# Patient Record
Sex: Female | Born: 1965 | Hispanic: No | Marital: Single | Smoking: Never smoker
Health system: Southern US, Community
[De-identification: ages and names within clinical notes are randomized; demographics above are authoritative.]

## PROBLEM LIST (undated history)

## (undated) DIAGNOSIS — I10 Essential (primary) hypertension: Secondary | ICD-10-CM

## (undated) DIAGNOSIS — E785 Hyperlipidemia, unspecified: Secondary | ICD-10-CM

## (undated) HISTORY — PX: TUBAL LIGATION: SHX77

---

## 1998-09-14 ENCOUNTER — Other Ambulatory Visit: Admission: RE | Admit: 1998-09-14 | Discharge: 1998-09-14 | Payer: Self-pay | Admitting: *Deleted

## 1999-02-07 ENCOUNTER — Encounter: Payer: Self-pay | Admitting: Obstetrics & Gynecology

## 1999-02-07 ENCOUNTER — Inpatient Hospital Stay (HOSPITAL_COMMUNITY): Admission: AD | Admit: 1999-02-07 | Discharge: 1999-02-07 | Payer: Self-pay | Admitting: *Deleted

## 1999-02-10 ENCOUNTER — Inpatient Hospital Stay (HOSPITAL_COMMUNITY): Admission: AD | Admit: 1999-02-10 | Discharge: 1999-02-11 | Payer: Self-pay | Admitting: Obstetrics & Gynecology

## 1999-02-12 ENCOUNTER — Inpatient Hospital Stay (HOSPITAL_COMMUNITY): Admission: AD | Admit: 1999-02-12 | Discharge: 1999-02-12 | Payer: Self-pay | Admitting: *Deleted

## 1999-03-02 ENCOUNTER — Inpatient Hospital Stay (HOSPITAL_COMMUNITY): Admission: AD | Admit: 1999-03-02 | Discharge: 1999-03-14 | Payer: Self-pay | Admitting: *Deleted

## 1999-03-02 ENCOUNTER — Encounter: Payer: Self-pay | Admitting: *Deleted

## 1999-03-24 ENCOUNTER — Inpatient Hospital Stay (HOSPITAL_COMMUNITY): Admission: AD | Admit: 1999-03-24 | Discharge: 1999-03-24 | Payer: Self-pay | Admitting: Obstetrics & Gynecology

## 1999-08-11 ENCOUNTER — Other Ambulatory Visit: Admission: RE | Admit: 1999-08-11 | Discharge: 1999-08-11 | Payer: Self-pay | Admitting: *Deleted

## 2001-10-20 ENCOUNTER — Other Ambulatory Visit: Admission: RE | Admit: 2001-10-20 | Discharge: 2001-10-20 | Payer: Self-pay | Admitting: *Deleted

## 2001-12-23 ENCOUNTER — Encounter: Payer: Self-pay | Admitting: *Deleted

## 2001-12-23 ENCOUNTER — Inpatient Hospital Stay (HOSPITAL_COMMUNITY): Admission: AD | Admit: 2001-12-23 | Discharge: 2001-12-28 | Payer: Self-pay | Admitting: *Deleted

## 2002-01-11 ENCOUNTER — Encounter: Payer: Self-pay | Admitting: *Deleted

## 2002-01-11 ENCOUNTER — Inpatient Hospital Stay (HOSPITAL_COMMUNITY): Admission: AD | Admit: 2002-01-11 | Discharge: 2002-01-11 | Payer: Self-pay | Admitting: *Deleted

## 2002-04-28 ENCOUNTER — Inpatient Hospital Stay (HOSPITAL_COMMUNITY): Admission: AD | Admit: 2002-04-28 | Discharge: 2002-04-28 | Payer: Self-pay | Admitting: *Deleted

## 2002-05-06 ENCOUNTER — Inpatient Hospital Stay (HOSPITAL_COMMUNITY): Admission: AD | Admit: 2002-05-06 | Discharge: 2002-05-10 | Payer: Self-pay | Admitting: *Deleted

## 2002-05-07 ENCOUNTER — Encounter (INDEPENDENT_AMBULATORY_CARE_PROVIDER_SITE_OTHER): Payer: Self-pay

## 2002-05-13 ENCOUNTER — Inpatient Hospital Stay (HOSPITAL_COMMUNITY): Admission: AD | Admit: 2002-05-13 | Discharge: 2002-05-13 | Payer: Self-pay | Admitting: *Deleted

## 2005-09-06 ENCOUNTER — Encounter: Admission: RE | Admit: 2005-09-06 | Discharge: 2005-09-06 | Payer: Self-pay | Admitting: Obstetrics

## 2010-03-13 ENCOUNTER — Emergency Department (HOSPITAL_BASED_OUTPATIENT_CLINIC_OR_DEPARTMENT_OTHER): Admission: EM | Admit: 2010-03-13 | Discharge: 2010-03-13 | Payer: Self-pay | Admitting: Emergency Medicine

## 2010-08-02 ENCOUNTER — Other Ambulatory Visit: Payer: Self-pay | Admitting: Obstetrics

## 2010-08-03 ENCOUNTER — Other Ambulatory Visit: Payer: Self-pay | Admitting: Obstetrics

## 2010-08-03 DIAGNOSIS — Z1231 Encounter for screening mammogram for malignant neoplasm of breast: Secondary | ICD-10-CM

## 2010-08-03 DIAGNOSIS — Z1239 Encounter for other screening for malignant neoplasm of breast: Secondary | ICD-10-CM

## 2010-08-11 ENCOUNTER — Ambulatory Visit
Admission: RE | Admit: 2010-08-11 | Discharge: 2010-08-11 | Disposition: A | Discharge status: Home / Self Care | Payer: PRIVATE HEALTH INSURANCE | Source: Ambulatory Visit | Attending: Obstetrics | Admitting: Obstetrics

## 2010-08-11 DIAGNOSIS — Z1231 Encounter for screening mammogram for malignant neoplasm of breast: Secondary | ICD-10-CM

## 2010-08-17 ENCOUNTER — Other Ambulatory Visit: Payer: Self-pay | Admitting: Obstetrics

## 2010-08-17 DIAGNOSIS — R928 Other abnormal and inconclusive findings on diagnostic imaging of breast: Secondary | ICD-10-CM

## 2010-08-30 ENCOUNTER — Other Ambulatory Visit: Payer: Self-pay | Admitting: Obstetrics

## 2010-08-30 ENCOUNTER — Ambulatory Visit
Admission: RE | Admit: 2010-08-30 | Discharge: 2010-08-30 | Disposition: A | Discharge status: Home / Self Care | Payer: PRIVATE HEALTH INSURANCE | Source: Ambulatory Visit | Attending: Obstetrics | Admitting: Obstetrics

## 2010-08-30 DIAGNOSIS — R928 Other abnormal and inconclusive findings on diagnostic imaging of breast: Secondary | ICD-10-CM

## 2010-11-17 NOTE — H&P (Signed)
[facility] of [facility]  Patient:    Angela Ingram, Angela Ingram Visit Number: [id number] MRN: [id number]          Service Type: OBS Location: 910A 9119 01 Attending Physician:  Pleas Koch Dictated by:   Georgina Peer, M.D. Admit Date:  12/23/2001                           History and Physical  ADMISSION DIAGNOSES:          1. Pregnancy, 18-1/2 weeks.                               2. Vaginal bleeding.                               3. Positive group B Streptococcus.  HISTORY OF PRESENT ILLNESS:   The patient is a 45 year old gravida 5, para 1-0-3-1, [facility] May 21, 2002, who presented with a one-day history of vaginal bleeding which was dark without cramping, positive fetal movement. She was evaluated in the office and found to have a closed cervix but shortened and a bloody discharge and was admitted for observation.  PAST MEDICAL HISTORY:        Prenatal:  The patient had early confirmed dates. She had a history of positive group B strep at 13 weeks with a negative wet prep, negative GC and Chlamydia cultures, good growth, an ultrasound showing normal anatomy, good growth, and a cervical length of 2.3 cm two weeks ago. Medical:  She had an elective abortion at eight weeks, a missed abortion at 11 weeks, both with D&Cs.  C-section in 1996.  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  A 23-week preterm delivery which presented with advanced cervical dilatation and bulging membranes in 2000, at which time group B strep was also positive.  The patient has also advanced maternal age and declined amniocentesis.  Other pertinent past medical history is negative.  REVIEW OF SYSTEMS:            Otherwise negative.  FAMILY HISTORY:               Not significant.  LABORATORY DATA:              Blood type O positive, antibody negative. Sickle negative.  VDRL nonreactive.  Rubella positive.  HBsAG negative.  Pap normal.  GC/Chlamydia negative.  GBS  positive, wet prep negative.  ALLERGIES:                    PENICILLIN, which causes rash.  PHYSICAL EXAMINATION:  VITAL SIGNS:                  Afebrile.  Vital signs stable.  GENERAL:                      A well-developed African-American female in no acute distress.  HEENT:                        Normal.  NECK:                         Without JVD or thyromegaly.  CHEST:  Lungs clear.  CARDIAC:                      Regular rate and rhythm.  BREASTS:                      Not examined.  ABDOMEN:                      Soft and nontender.  Uterus is one fingerbreadth below the umbilicus, consistent with 18-19 weeks.  There are fetal heart tones in the midline, which are 140.  There is no lower abdominal tenderness.  There is no CVA tenderness.  PELVIC:                       Normal external genital.  Dark blood in the vagina.  Cervix was noted to be closed but shortened.  The presenting part was not palpable.  Group B strep was repeated.  EXTREMITIES:                  Without cyanosis or edema.  IMPRESSION:                   1. Eighteen plus weeks.                               2. Positive group B Streptococcus.                               3. Vaginal bleeding.                               4. Shortened cervix.                               5. History of previous preterm loss.  PLAN:                         The patient will be admitted for IV antibiotics in the form of Clindamycin 900 mg q.8h., bed rest.  An ultrasound will be performed to look at cervical length and growth.  Consult with Conni Elliot, M.D.  Will check sensitivities on the group B strep, which was done before the Clindamycin was started.  Close monitoring of this patient for cervical change and preterm delivery. Dictated by:   Georgina Peer, M.D. Attending Physician:  Pleas Koch DD:  12/23/01 TD:  12/23/01 Job: [id number]

## 2010-11-17 NOTE — Discharge Summary (Signed)
[facility] of [facility]  Patient:    Angela Ingram, Angela Ingram Visit Number: [id number] MRN: [id number]          Service Type: MED Location: 910A 9119 01 Attending Physician:  Pleas Koch Dictated by:   Georgina Peer, M.D. Admit Date:  12/23/2001 Discharge Date: 12/28/2001                             Discharge Summary  ADMISSION DIAGNOSES:          1. Pregnancy at 18-1/2 weeks.                               2. Vaginal bleeding.                               3. Positive Group B Strep.                               4. Possible incompetent cervix.  DISCHARGE DIAGNOSES:          1. Pregnancy at 18-1/2 weeks.                               2. Vaginal bleeding, resolved.                               3. Positive Group B Strep.                               4. Possible incompetent cervix.  BRIEF HISTORY:                A 45 year old, gravida 5, para 1-0-3-1, [facility] May 21, 2002, presented with vaginal bleeding, shortened cervix, and positive Group B Strep.  HOSPITAL COURSE:              The patient was admitted and placed on Clindamycin because of a penicillin allergy.  The bleeding resolved within 24 hours.  The patient had been on oral Clindamycin approximately two weeks earlier.  A Group B Strep was repeated and was negative.  Consultation with Conni Elliot, M.D. was performed.  It was thought that with the shortened cervix, the patient would be a cerclage candidate after antibiotic treatment. An ultrasound had been performed showing normal fluid, normal growth, and their cervical measurement was 5.3 cm, however, in the office it was 2.3 cm. The patient had a history of a LEEP procedure prior to this pregnancy and a previous pregnancy loss.  She was doing well and was taken for a cervical cerclage on December 26, 2001, which was done under spinal.  There was very little vaginal cervix left.  Approximately 1 to 1.5 cm of cervix was incorporated into the  cerclage stitch which was then tied securely with the cervix closed. She was continued on clindamycin postoperatively.  She did not have a fever. She had minimal cramping which responded to Motrin.  By the second postoperative day, December 28, 2001, the patient was doing well and was ready for discharge.  Her abdomen was soft and nontender, fetal heart rate was 140 to 150 and she  was having no red vaginal bleeding.  She was sent home on bed rest with bathroom privileges and Tylenol or Motrin as needed.  She will follow up in the office in four days for cervical examination. Dictated by:   Georgina Peer, M.D. Attending Physician:  Pleas Koch DD:  12/28/01 TD:  12/29/01 Job: [id number]

## 2010-11-17 NOTE — Op Note (Signed)
[facility] of [facility]  Patient:    Angela Ingram, Angela Ingram Visit Number: [id number] MRN: [id number]          Service Type: MED Location: 910A 9119 01 Attending Physician:  Pleas Koch Dictated by:   Georgina Peer, M.D. Proc. Date: 12/26/01 Admit Date:  12/23/2001 Discharge Date: 12/28/2001                             Operative Report  PREOPERATIVE DIAGNOSES:       1. Intrauterine pregnancy at 19 weeks.                               2. Incompetent cervix.                               3. Positive group B strep.                               4. Second trimester bleeding.  POSTOPERATIVE DIAGNOSES:      1. Intrauterine pregnancy at 19 weeks.                               2. Incompetent cervix.                               3. Positive group B strep.                               4. Second trimester bleeding.  OPERATION PERFORMED:          McDonald cervical cerclage.  SURGEON:                      Georgina Peer, M.D.  ANESTHESIA:                   Spinal.  ANESTHESIOLOGIST:             Ellison Hughs., M.D.  ESTIMATED BLOOD LOSS:         25 cc.  COMPLICATIONS:                None.  FINDINGS:                     Shortened vaginal portion of the cervix to 1.5  cm with distorted anatomy status post LEEP procedure.  INDICATIONS:                  This is a 45 year old gravida 5, para 1-0-3-1 with a previous 22-week loss from early dilation, preterm labor and group B strep.  She was admitted on June 24 with vaginal bleeding and positive group B strep.  She was placed on IV antibiotics.  She had a shortened vaginal cervical length of 1.5 cm, but a total cervical length of by ultrasound of between 2.3 and 5.3 cm on subsequent ultrasounds.  The bleeding stopped within IV clindamycin, as the patient was penicillin allergic.  After consultation with Dr. Corky Sox, it was felt that McDonald cervical cerclage would give the patient an increased  chance of  a viable pregnancy that would progress further than without, given the patients prior history of loss at 22 weeks and LEEP procedure.  The patient agreed and was willing to proceed.  DESCRIPTION OF PROCEDURE:     The patient was taken to the operating room, given a spinal anesthetic and placed in the dorsal lithotomy position.  Fetal heart tones were auscultated.  The cervix was anterior along the right vaginal side wall.  It was difficult to discern from the vaginal mucosa.  However, with adequate retraction, the cervix was noted and the cervical os was noted to be closed.  A Mersilene tape was used, starting at the 12 oclock position and going from 12 to 9, 9 to 6, 6 to 3, 3 to 12.  A Prolene was placed under the knot to help elevate it for removal, and both knots were securely tied. There was minimal bleeding.  There was approximately 1.5 cm of cervical length at the end of the procedure and the os was tightly closed.  The patient was transferred to the recovery area in good condition.  Sponge, needle and instrument counts were correct.  The patient will return to her mother-baby room to continue IV clindamycin. Dictated by:   Georgina Peer, M.D. Attending Physician:  Pleas Koch DD:  12/26/01 TD:  12/28/01 Job: [id number]

## 2010-11-17 NOTE — Discharge Summary (Signed)
NAME:  Angela Ingram, Angela Ingram                            ACCOUNT NO.:  [id number]   MEDICAL RECORD NO.:  [id number]                   PATIENT TYPE:  INP   LOCATION:  9128                                 FACILITY:  [facility]   PHYSICIAN:  Georgina Peer, M.D.              DATE OF BIRTH:  August 12, 1965   DATE OF ADMISSION:  05/06/2002  DATE OF DISCHARGE:  05/10/2002                                 DISCHARGE SUMMARY   ADMISSION DIAGNOSES:  1. Pregnancy, 38 weeks.  2. Previous cesarean section.  3. Failed attempted vaginal birth after cesarean section.  4. Desiring permanent sterilization.   POSTOPERATIVE DIAGNOSES:  1. Pregnancy, 38 weeks.  2. Previous cesarean section.  3. Failed attempted vaginal birth after cesarean section.  4. Desiring permanent sterilization.  5. Post dural puncture headache.  6. Compensated anemia.   BRIEF HISTORY:  A 45 year old gravida 5 para 1-0-3-1, [facility] May 21, 2002,  presented with spontaneous rupture of membranes on November 5.  She was  allowed to labor, knowing the risks and complications of vaginal birth after  cesarean.  She had had a cerclage removed at 36.5 weeks, which was placed at  19 weeks following cervical incompetence diagnosis.  She wanted a postpartum  tubal ligation.   HOSPITAL COURSE:  She labored, was augmented, but failed to progress past 3-  4 cm with adequate contractions.  She was taken for repeat low transverse  cesarean section and postpartum tubal ligation with a blood loss of 1000 cc;  a viable female infant, Szia, weighing 6 pounds 10 ounces, Apgars of 8 and  9.  Tubal ligation was accomplished without difficulty.  Postoperatively the  patient did well except did have a positional headache, was seen by Dr.  Leilani Able of the anesthesia department who counseled her on  different options.  The patient was given caffeine, IV fluids, and supine  positioning.  The headache was still present by the third day and she  declined  further intervention.  She was on Percocet for pain.  She passed  gas and had a bowel movement by the third day.  She was on a regular diet.  Her wound was intact and dry.  She was voiding without difficulty.  She had  no nausea and vomiting.  The patient had a hemoglobin of 8.6 with a  preoperative hemoglobin of 11.4 and a wbc of 10.3, and adequate platelet  count.  Her staples were removed on the third day.  She was doing well on  Percocet and Motrin and was discharged home with a discharge booklet and  instructions.   DISPOSITION:  She will follow up in the office in two weeks.  She was also  given a prescription for Niferex-150 Forte to take once or twice daily as  tolerated.  Georgina Peer, M.D.    JPN/MEDQ  D:  05/10/2002  T:  05/11/2002  Job:  [id number]

## 2010-11-17 NOTE — Op Note (Signed)
NAME:  Angela Ingram, Angela Ingram                            ACCOUNT NO.:  [id number]   MEDICAL RECORD NO.:  [id number]                   PATIENT TYPE:  INP   LOCATION:  9128                                 FACILITY:  [facility]   PHYSICIAN:  Georgina Peer, M.D.              DATE OF BIRTH:  08/11/1965   DATE OF PROCEDURE:  05/07/2002  DATE OF DISCHARGE:                                 OPERATIVE REPORT   PREOPERATIVE DIAGNOSES:  1. Intrauterine pregnancy 39 weeks.  2. Failed vaginal birth after cesarean.  3. Desires elective sterilization.  4. Fetal tachycardia.   POSTOPERATIVE DIAGNOSES:  1. Intrauterine pregnancy 39 weeks.  2. Failed vaginal birth after cesarean.  3. Desires elective sterilization.  4. Fetal tachycardia.   OPERATION PERFORMED:  1. Repeat low cervical transverse cesarean section.  2. Postpartum tubal ligation.   SURGEON:  Georgina Peer, M.D.   ANESTHESIA:  Epidural.   ESTIMATED BLOOD LOSS:  1000 cc.   FINDINGS:  Viable female infant delivered at 7:13 a.m. 6 pounds 10 ounces  with Apgars of 8 and 9.  Normal tubes and ovaries.   INDICATIONS:  This is a 45 year old gravida 5, para 1-0-3-1, [facility] May 21, 2002 presented with ruptured membranes at 38 weeks, 3 cm dilated.  The  patient progressed to 3-4 cm and received an epidural.  Contractions space  and Pitocin augmented.  The fetal vertex was not well engaged in the pelvis  at -3, but vertex by ultrasound.  Intrauterine pressure catheter was placed  and Pitocin augmentation failed to cause dilation of the cervix or  effacement or descent of the head.  A baseline fetal tachycardia was noted  between 160-175 with accelerations and no decelerations and there was good  beat to beat variability.  However, at 6 a.m. since the patient had not  changed since 1 a.m. it was decided that failure to progress was diagnosed  and a repeat cesarean section was planned.  The patient also desired  sterilization which had been  discussed in the office.  Risks and  complications of the sterilization and failure rate of 3-4 per 1000 as a  lifetime risk was acknowledged by the patient.   DESCRIPTION OF PROCEDURE:  The patient was taken to the operating room,  placed in the dorsal supine left lateral uterine displacement position.  Foley catheter had been placed in her bladder and an epidural was then  placed during labor which was dosed appropriately.  She was prepped and  draped and a transverse incision was made lower abdomen in the area of the  previous abdominal scar.  This was taken down into the peritoneal cavity.  There was evidence of scarring of the abdominal wall layers and these were  dissected free.  The peritoneal cavity was entered and there was noted to be  two areas of omental adhesions to the anterior abdominal wall.  The  peritoneal incision was widened.  A transverse bladder flap was created and  a transverse uterine incision was made.  At 7:13 a.m. infant was delivered  with the aid of fundal pressure.  Cried spontaneously and was a female who  weighed 6 pounds 10 ounces.  Cord was doubly clamped and cut, the infant  handed to the pediatric team in attendance.  Cord blood was taken.  The  placenta and membranes were removed without defect.  Uterine incision was  without defect.  It was closed with 0 Monocryl.  A second layer of Monocryl  was needed to control bleeding.  Interrupted sutures of Monocryl and Vicryl  were used to control bleeding along the edges of the incision which was  finally hemostatic.  The pelvis was irrigated.  Attention was then taken to  the tubes.  The left tube was identified.  The left ovary was normal.  The  tube was traced to its fimbriated end, a knuckle elevated with the Babcock  clamp, and two plain gut sutures were tied around the tube and the mid  portion excised.  Both lumens identified.  Bleeders were cauterized.  A  similar procedure was noted and performed on  the right tube which was traced  to its fimbriated end, doubly tied over a loop which had been elevated with  the Babcock clamp.  The mid portion knuckle was then excised and the lumens  identified and the bleeders cauterized.  The incision of the uterus was  again attended to and was hemostatic.  The bladder flap was hemostatic.  The  peritoneum was closed after the omental adhesions were lysed and free tied.  There was oozing over the muscles in an area that was adherent.  These were  cauterized and ligated with small sutures.  The fascia was closed with  Vicryl suture, the subcutaneous with a plain suture, and the skin with skin  staples.  The patient received 1 g of Cefotetan after the cord was clamped.  This will be continued for 24 hours.  Sponge, needle, and instrument counts  were correct.  The patient was taken to recovery in stable condition.                                               Georgina Peer, M.D.    JPN/MEDQ  D:  05/07/2002  T:  05/07/2002  Job:  [id number]

## 2013-07-02 HISTORY — PX: BREAST BIOPSY: SHX20

## 2014-04-05 ENCOUNTER — Other Ambulatory Visit: Payer: Self-pay

## 2014-04-05 DIAGNOSIS — N63 Unspecified lump in unspecified breast: Secondary | ICD-10-CM

## 2014-04-06 ENCOUNTER — Telehealth: Payer: Self-pay | Admitting: Obstetrics

## 2014-04-13 NOTE — Telephone Encounter (Signed)
10.13.2015 - patient appt scheduled brm

## 2014-04-19 ENCOUNTER — Ambulatory Visit (INDEPENDENT_AMBULATORY_CARE_PROVIDER_SITE_OTHER): Payer: BC Managed Care – PPO | Admitting: Obstetrics

## 2014-04-19 ENCOUNTER — Encounter: Payer: Self-pay | Admitting: Obstetrics

## 2014-04-19 ENCOUNTER — Other Ambulatory Visit: Payer: Self-pay | Admitting: Obstetrics

## 2014-04-19 VITALS — BP 170/98 | HR 73 | Temp 97.4°F | Ht 69.0 in | Wt 211.0 lb

## 2014-04-19 DIAGNOSIS — N6310 Unspecified lump in the right breast, unspecified quadrant: Secondary | ICD-10-CM

## 2014-04-19 DIAGNOSIS — I10 Essential (primary) hypertension: Secondary | ICD-10-CM

## 2014-04-19 DIAGNOSIS — Z01419 Encounter for gynecological examination (general) (routine) without abnormal findings: Secondary | ICD-10-CM

## 2014-04-19 DIAGNOSIS — Z0000 Encounter for general adult medical examination without abnormal findings: Secondary | ICD-10-CM

## 2014-04-19 LAB — WET PREP BY MOLECULAR PROBE
Candida species: NEGATIVE
Gardnerella vaginalis: NEGATIVE
Trichomonas vaginosis: NEGATIVE

## 2014-04-19 MED ORDER — TRIAMTERENE-HCTZ 37.5-25 MG PO CAPS: 1.0000 | ORAL_CAPSULE | Freq: Every day | ORAL | Status: AC

## 2014-04-19 MED ORDER — CARVEDILOL 6.25 MG PO TABS: 6.2500 mg | ORAL_TABLET | Freq: Two times a day (BID) | ORAL | Status: AC

## 2014-04-20 ENCOUNTER — Encounter: Payer: Self-pay | Admitting: Obstetrics

## 2014-04-20 NOTE — Progress Notes (Signed)
Subjective:     Angela Ingram is a 48 y.o. female here for a routine exam.  Current complaints: none.    Personal health questionnaire:  Is patient Angela Ingram, have a family history of breast and/or ovarian cancer: no Is there a family history of uterine cancer diagnosed at age < 6650, gastrointestinal cancer, urinary tract cancer, family member who is a Personnel officerLynch syndrome-associated carrier: no Is the patient overweight and hypertensive, family history of diabetes, personal history of gestational diabetes or PCOS: no Is patient over 9555, have PCOS,  family history of premature CHD under age 48, diabetes, smoke, have hypertension or peripheral artery disease:  no At any time, has a partner hit, kicked or otherwise hurt or frightened you?: no Over the past 2 weeks, have you felt down, depressed or hopeless?: no Over the past 2 weeks, have you felt little interest or pleasure in doing things?:no   Gynecologic History Patient's last menstrual period was 04/03/2014. Contraception: tubal ligation Last Pap: unknown. Results were: normal Last mammogram: 2012. Results were: normal  Obstetric History OB History  Gravida Para Term Preterm AB SAB TAB Ectopic Multiple Living  4 2 2  2 2    2     # Outcome Date GA Lbr Len/2nd Weight Sex Delivery Anes PTL Lv  4 TRM     F LTCS EPI  Y  3 SAB           2 TRM     M LTCS EPI  Y  1 SAB               History reviewed. No pertinent past medical history.  Past Surgical History  Procedure Laterality Date  . Cesarean section    . Tubal ligation      Current outpatient prescriptions:carvedilol (COREG) 6.25 MG tablet, Take 1 tablet (6.25 mg total) by mouth 2 (two) times daily with a meal., Disp: 60 tablet, Rfl: 11 Current facility-administered medications:triamterene-hydrochlorothiazide (DYAZIDE) 37.5-25 MG per capsule 1 capsule, 1 capsule, Oral, Daily, [name], MD Allergies  Allergen Reactions  . Penicillins Hives    History  Substance Use  Topics  . Smoking status: Never Smoker   . Smokeless tobacco: Never Used  . Alcohol Use: No    Family History  Problem Relation Age of Onset  . Hypertension Mother       Review of Systems  Constitutional: negative for fatigue and weight loss Respiratory: negative for cough and wheezing Cardiovascular: negative for chest pain, fatigue and palpitations Gastrointestinal: negative for abdominal pain and change in bowel habits Musculoskeletal:negative for myalgias Neurological: negative for gait problems and tremors Behavioral/Psych: negative for abusive relationship, depression Endocrine: negative for temperature intolerance   Genitourinary:negative for abnormal menstrual periods, genital lesions, hot flashes, sexual problems and vaginal discharge Integument/breast:  Positive for right breast lump    Objective:       BP 170/98  Pulse 73  Temp(Src) 97.4 F (36.3 C)  Ht 5\' 9"  (1.753 m)  Wt 211 lb (95.709 kg)  BMI 31.15 kg/m2  LMP 04/03/2014 General:   alert  Skin:   no rash or abnormalities  Lungs:   clear to auscultation bilaterally  Heart:   regular rate and rhythm, S1, S2 normal, no murmur, click, rub or gallop  Breasts:   mass in right breast  Abdomen:  normal findings: no organomegaly, soft, non-tender and no hernia  Pelvis:  External genitalia: normal general appearance Urinary system: urethral meatus normal and bladder without fullness,  nontender Vaginal: normal without tenderness, induration or masses Cervix: normal appearance Adnexa: normal bimanual exam Uterus: anteverted and non-tender, normal size   Lab Review Urine pregnancy test Labs reviewed yes Radiologic studies reviewed yes    Assessment:    Healthy female exam.   Breast mass  HTN   Plan:    Education reviewed: calcium supplements, low fat, low cholesterol diet, self breast exams and weight bearing exercise. Follow up in: 1 year.  Mammogram ordered Referred to PCP for HTN  Meds ordered  this encounter  Medications  . triamterene-hydrochlorothiazide (DYAZIDE) 37.5-25 MG per capsule 1 capsule    Sig:   . carvedilol (COREG) 6.25 MG tablet    Sig: Take 1 tablet (6.25 mg total) by mouth 2 (two) times daily with a meal.    Dispense:  60 tablet    Refill:  11   Orders Placed This Encounter  Procedures  . WET PREP BY MOLECULAR PROBE  . MM Digital Diagnostic Bilat    EPIC ORDER PF 08/11/2010 [facility]   NO NEEDS  NP/PT BCBS     Standing Status: Future     Number of Occurrences:      Standing Expiration Date: 06/20/2015    Scheduling Instructions:     Breast mass at 11 and 6 o' clock, right breast, non tender, soft  and mobile.    Order Specific Question:  Reason for Exam (SYMPTOM  OR DIAGNOSIS REQUIRED)    Answer:  Breast lumps    Order Specific Question:  Is the patient pregnant?    Answer:  No    Order Specific Question:  Preferred imaging location?    [facility]  . Ambulatory referral to Internal Medicine    Referral Priority:  Routine    Referral Type:  Consultation    Referral Reason:  Specialty Services Required    Requested Specialty:  Internal Medicine    Number of Visits Requested:  1

## 2014-04-21 LAB — PAP IG AND HPV HIGH-RISK: HPV DNA HIGH RISK: NOT DETECTED

## 2014-04-22 ENCOUNTER — Other Ambulatory Visit: Payer: Self-pay | Admitting: Obstetrics

## 2014-04-22 ENCOUNTER — Other Ambulatory Visit: Payer: Self-pay

## 2014-04-22 DIAGNOSIS — N6310 Unspecified lump in the right breast, unspecified quadrant: Secondary | ICD-10-CM

## 2014-04-28 ENCOUNTER — Other Ambulatory Visit: Payer: Self-pay | Admitting: Obstetrics

## 2014-04-28 ENCOUNTER — Ambulatory Visit
Admission: RE | Admit: 2014-04-28 | Discharge: 2014-04-28 | Disposition: A | Discharge status: Home / Self Care | Payer: BC Managed Care – PPO | Source: Ambulatory Visit | Attending: Obstetrics | Admitting: Obstetrics

## 2014-04-28 DIAGNOSIS — N6310 Unspecified lump in the right breast, unspecified quadrant: Secondary | ICD-10-CM

## 2014-05-03 ENCOUNTER — Encounter: Payer: Self-pay | Admitting: Obstetrics

## 2014-05-03 ENCOUNTER — Other Ambulatory Visit: Payer: Self-pay | Admitting: Obstetrics

## 2014-05-03 DIAGNOSIS — N6310 Unspecified lump in the right breast, unspecified quadrant: Secondary | ICD-10-CM

## 2014-05-07 ENCOUNTER — Other Ambulatory Visit: Payer: Self-pay | Admitting: Obstetrics

## 2014-05-07 ENCOUNTER — Ambulatory Visit
Admission: RE | Admit: 2014-05-07 | Discharge: 2014-05-07 | Disposition: A | Discharge status: Home / Self Care | Payer: BC Managed Care – PPO | Source: Ambulatory Visit | Attending: Obstetrics | Admitting: Obstetrics

## 2014-05-07 DIAGNOSIS — N6310 Unspecified lump in the right breast, unspecified quadrant: Secondary | ICD-10-CM

## 2014-05-17 ENCOUNTER — Other Ambulatory Visit: Payer: Self-pay | Admitting: Obstetrics

## 2014-05-17 ENCOUNTER — Other Ambulatory Visit: Payer: Self-pay

## 2014-05-17 DIAGNOSIS — N6310 Unspecified lump in the right breast, unspecified quadrant: Secondary | ICD-10-CM

## 2014-05-20 ENCOUNTER — Ambulatory Visit
Admission: RE | Admit: 2014-05-20 | Discharge: 2014-05-20 | Disposition: A | Discharge status: Home / Self Care | Payer: BC Managed Care – PPO | Source: Ambulatory Visit | Attending: Obstetrics | Admitting: Obstetrics

## 2014-05-20 ENCOUNTER — Other Ambulatory Visit: Payer: Self-pay | Admitting: Obstetrics

## 2014-05-20 DIAGNOSIS — N6310 Unspecified lump in the right breast, unspecified quadrant: Secondary | ICD-10-CM

## 2014-09-17 ENCOUNTER — Emergency Department (HOSPITAL_BASED_OUTPATIENT_CLINIC_OR_DEPARTMENT_OTHER)
Admission: EM | Admit: 2014-09-17 | Discharge: 2014-09-18 | Disposition: A | Discharge status: Home / Self Care | Payer: BC Managed Care – PPO | Attending: Emergency Medicine | Admitting: Emergency Medicine

## 2014-09-17 ENCOUNTER — Encounter (HOSPITAL_BASED_OUTPATIENT_CLINIC_OR_DEPARTMENT_OTHER): Payer: Self-pay | Admitting: Emergency Medicine

## 2014-09-17 DIAGNOSIS — R42 Dizziness and giddiness: Secondary | ICD-10-CM | POA: Diagnosis present

## 2014-09-17 DIAGNOSIS — H81391 Other peripheral vertigo, right ear: Secondary | ICD-10-CM | POA: Insufficient documentation

## 2014-09-17 DIAGNOSIS — H6121 Impacted cerumen, right ear: Secondary | ICD-10-CM | POA: Insufficient documentation

## 2014-09-17 DIAGNOSIS — Z3202 Encounter for pregnancy test, result negative: Secondary | ICD-10-CM | POA: Diagnosis not present

## 2014-09-17 DIAGNOSIS — I10 Essential (primary) hypertension: Secondary | ICD-10-CM | POA: Diagnosis not present

## 2014-09-17 DIAGNOSIS — Z880 Allergy status to penicillin: Secondary | ICD-10-CM | POA: Diagnosis not present

## 2014-09-17 DIAGNOSIS — Z79899 Other long term (current) drug therapy: Secondary | ICD-10-CM | POA: Insufficient documentation

## 2014-09-17 HISTORY — DX: Essential (primary) hypertension: I10

## 2014-09-17 LAB — PREGNANCY, URINE: Preg Test, Ur: NEGATIVE

## 2014-09-17 NOTE — ED Notes (Signed)
Pt sts dizziness episode 1 week ago and again today; no sycnope; has pink eye now, otherwise no other illnesses.

## 2014-09-18 ENCOUNTER — Emergency Department (HOSPITAL_BASED_OUTPATIENT_CLINIC_OR_DEPARTMENT_OTHER): Payer: BC Managed Care – PPO

## 2014-09-18 ENCOUNTER — Encounter (HOSPITAL_BASED_OUTPATIENT_CLINIC_OR_DEPARTMENT_OTHER): Payer: Self-pay | Admitting: Emergency Medicine

## 2014-09-18 LAB — CBC WITH DIFFERENTIAL/PLATELET
BASOS ABS: 0 10*3/uL (ref 0.0–0.1)
Basophils Relative: 0 % (ref 0–1)
EOS PCT: 2 % (ref 0–5)
Eosinophils Absolute: 0.1 10*3/uL (ref 0.0–0.7)
HCT: 36.4 % (ref 36.0–46.0)
Hemoglobin: 11.4 g/dL — ABNORMAL LOW (ref 12.0–15.0)
Lymphocytes Relative: 24 % (ref 12–46)
Lymphs Abs: 1.6 10*3/uL (ref 0.7–4.0)
MCH: 27.4 pg (ref 26.0–34.0)
MCHC: 31.3 g/dL (ref 30.0–36.0)
MCV: 87.5 fL (ref 78.0–100.0)
Monocytes Absolute: 0.6 10*3/uL (ref 0.1–1.0)
Monocytes Relative: 9 % (ref 3–12)
NEUTROS ABS: 4.4 10*3/uL (ref 1.7–7.7)
NEUTROS PCT: 65 % (ref 43–77)
Platelets: 279 10*3/uL (ref 150–400)
RBC: 4.16 MIL/uL (ref 3.87–5.11)
RDW: 15 % (ref 11.5–15.5)
WBC: 6.7 10*3/uL (ref 4.0–10.5)

## 2014-09-18 LAB — COMPREHENSIVE METABOLIC PANEL
ALK PHOS: 82 U/L (ref 39–117)
ALT: 29 U/L (ref 0–35)
AST: 76 U/L — ABNORMAL HIGH (ref 0–37)
Albumin: 4.3 g/dL (ref 3.5–5.2)
Anion gap: 8 (ref 5–15)
BUN: 15 mg/dL (ref 6–23)
CHLORIDE: 107 mmol/L (ref 96–112)
CO2: 26 mmol/L (ref 19–32)
Calcium: 9.1 mg/dL (ref 8.4–10.5)
Creatinine, Ser: 1.04 mg/dL (ref 0.50–1.10)
GFR calc non Af Amer: 62 mL/min — ABNORMAL LOW (ref >90)
GFR, EST AFRICAN AMERICAN: 72 mL/min — AB (ref >90)
GLUCOSE: 107 mg/dL — AB (ref 70–99)
Potassium: 3.2 mmol/L — ABNORMAL LOW (ref 3.5–5.1)
SODIUM: 141 mmol/L (ref 135–145)
TOTAL PROTEIN: 7.3 g/dL (ref 6.0–8.3)
Total Bilirubin: 0.2 mg/dL — ABNORMAL LOW (ref 0.3–1.2)

## 2014-09-18 MED ORDER — POTASSIUM CHLORIDE CRYS ER 20 MEQ PO TBCR
EXTENDED_RELEASE_TABLET | ORAL | Status: AC
  Filled 2014-09-18: qty 1

## 2014-09-18 MED ORDER — POTASSIUM CHLORIDE CRYS ER 20 MEQ PO TBCR
40.0000 meq | EXTENDED_RELEASE_TABLET | Freq: Once | ORAL | Status: AC
  Administered 2014-09-18: 40 meq via ORAL
  Filled 2014-09-18: qty 2

## 2014-09-18 NOTE — ED Provider Notes (Signed)
CSN: [id number]     Arrival date & time 09/17/14  2227 History   First MD Initiated Contact with Patient 09/18/14 0050     Chief Complaint  Patient presents with  . Dizziness     (Consider location/radiation/quality/duration/timing/severity/associated sxs/prior Treatment) Patient is a 49 y.o. female presenting with dizziness. The history is provided by the patient.  Dizziness Quality:  Head spinning and vertigo Severity:  Severe Onset quality:  Sudden Timing:  Constant Progression:  Resolved Chronicity:  Recurrent Context: not with eye movement   Relieved by:  Nothing Worsened by:  Nothing Ineffective treatments:  None tried Associated symptoms: tinnitus   Associated symptoms: no headaches, no vision changes and no weakness   Risk factors: no anemia   Has had URI x 2 weeks with congestion and sneezing  Past Medical History  Diagnosis Date  . Hypertension    Past Surgical History  Procedure Laterality Date  . Cesarean section    . Tubal ligation     Family History  Problem Relation Age of Onset  . Hypertension Mother    History  Substance Use Topics  . Smoking status: Never Smoker   . Smokeless tobacco: Never Used  . Alcohol Use: No   OB History    Gravida Para Term Preterm AB TAB SAB Ectopic Multiple Living   4 2 2  2  2   2      Review of Systems  Constitutional: Negative for fever.  HENT: Positive for tinnitus.   Neurological: Positive for dizziness. Negative for tremors, seizures, syncope, facial asymmetry, speech difficulty, weakness, light-headedness, numbness and headaches.  All other systems reviewed and are negative.     Allergies  Penicillins  Home Medications   Prior to Admission medications   Medication Sig Start Date End Date Taking? Authorizing Provider  carvedilol (COREG) 6.25 MG tablet Take 1 tablet (6.25 mg total) by mouth 2 (two) times daily with a meal. 04/19/14   [name], MD   BP 146/88 mmHg  Temp(Src) 98.5 F (36.9 C)  (Oral)  Resp 16  Ht 5\' 9"  (1.753 m)  Wt 205 lb (92.987 kg)  BMI 30.26 kg/m2  SpO2 100% Physical Exam  Constitutional: She is oriented to person, place, and time. She appears well-developed and well-nourished. No distress.  HENT:  Head: Normocephalic and atraumatic.  Left Ear: External ear normal.  Mouth/Throat: Oropharynx is clear and moist.  Impacted right TM  Eyes: EOM are normal. Pupils are equal, round, and reactive to light.  Neck: Normal range of motion. Neck supple.  Cardiovascular: Normal rate, regular rhythm and intact distal pulses.   Pulmonary/Chest: Effort normal and breath sounds normal. No respiratory distress. She has no wheezes. She has no rales.  Abdominal: Soft. Bowel sounds are normal. There is no tenderness. There is no rebound and no guarding.  Musculoskeletal: Normal range of motion.  Neurological: She is alert and oriented to person, place, and time. She has normal reflexes. She displays normal reflexes. No cranial nerve deficit. She exhibits normal muscle tone. Coordination normal.  Skin: Skin is warm and dry.  Psychiatric: She has a normal mood and affect.    ED Course  Procedures (including critical care time) Labs Review Labs Reviewed  CBC WITH DIFFERENTIAL/PLATELET - Abnormal; Notable for the following:    Hemoglobin 11.4 (*)    All other components within normal limits  COMPREHENSIVE METABOLIC PANEL - Abnormal; Notable for the following:    Potassium 3.2 (*)    Glucose, Bld  107 (*)    AST 76 (*)    Total Bilirubin 0.2 (*)    GFR calc non Af Amer 62 (*)    GFR calc Af Amer 72 (*)    All other components within normal limits  PREGNANCY, URINE    Imaging Review No results found.   EKG Interpretation None      MDM   Final diagnoses:  None    Symptoms consistent with peripheral vertigo.  Thinks she has pink eye will treat with erythro ointment    Travas Schexnayder, MD 09/18/14 [phone]

## 2014-09-18 NOTE — ED Notes (Signed)
Dizziness x 2 in past week   Ambulatory without diff

## 2014-12-27 ENCOUNTER — Other Ambulatory Visit: Payer: Self-pay | Admitting: Obstetrics

## 2014-12-27 DIAGNOSIS — N6001 Solitary cyst of right breast: Secondary | ICD-10-CM

## 2015-01-06 ENCOUNTER — Ambulatory Visit
Admission: RE | Admit: 2015-01-06 | Discharge: 2015-01-06 | Disposition: A | Discharge status: Home / Self Care | Payer: BC Managed Care – PPO | Source: Ambulatory Visit | Attending: Obstetrics | Admitting: Obstetrics

## 2015-01-06 DIAGNOSIS — N6001 Solitary cyst of right breast: Secondary | ICD-10-CM

## 2015-04-26 ENCOUNTER — Ambulatory Visit: Payer: BC Managed Care – PPO | Admitting: Obstetrics

## 2015-07-06 ENCOUNTER — Ambulatory Visit: Payer: BC Managed Care – PPO | Admitting: Obstetrics

## 2016-05-29 IMAGING — CT CT HEAD W/O CM
1 series · 16 of 30 positions shown, 20 images · non-contrast
Comparison: None.

CLINICAL DATA: Dizziness

EXAM:
CT HEAD WITHOUT CONTRAST
TECHNIQUE: Contiguous axial images were obtained from the base of the skull
through the vertex without intravenous contrast.

[Series 2: head 4.8 h37s · axial · 0.45mm/px · z∈[+1224,+1357]mm · 16 of 32 slices shown, 20 images]
[im 2/32  brain]
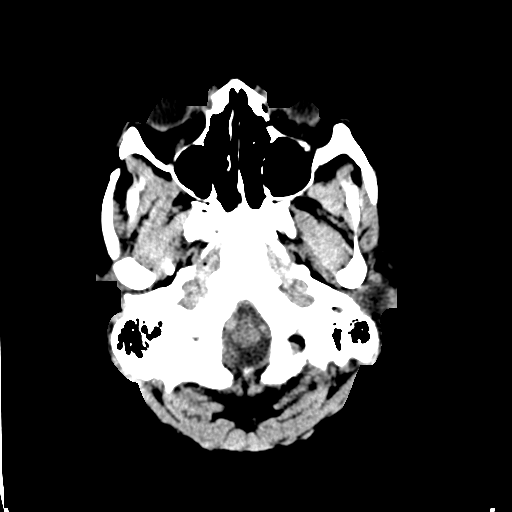
[im 2/32  bone]
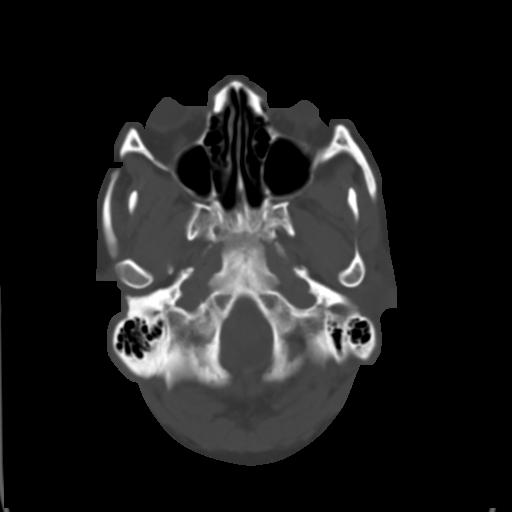
[im 4/32  brain]
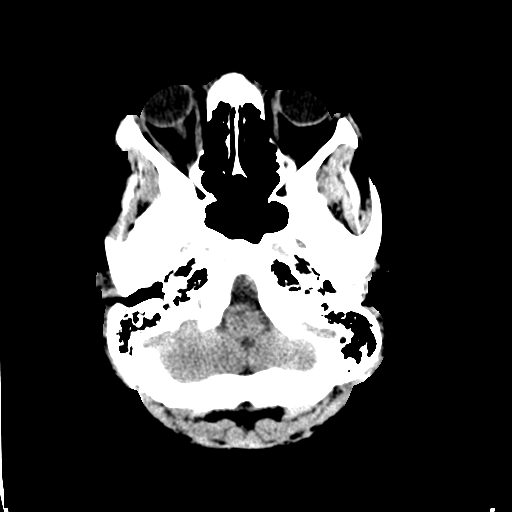
[im 6/32  brain]
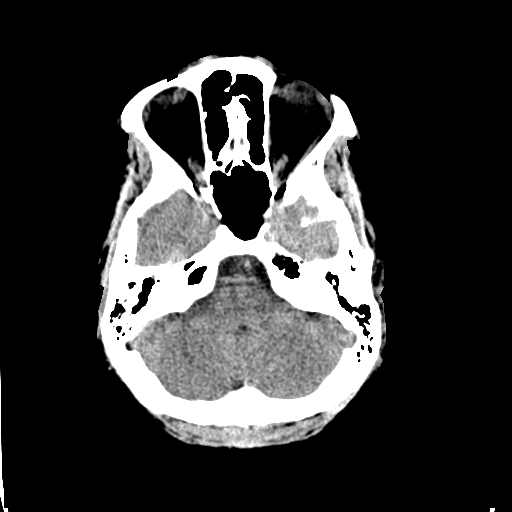
[im 8/32  brain]
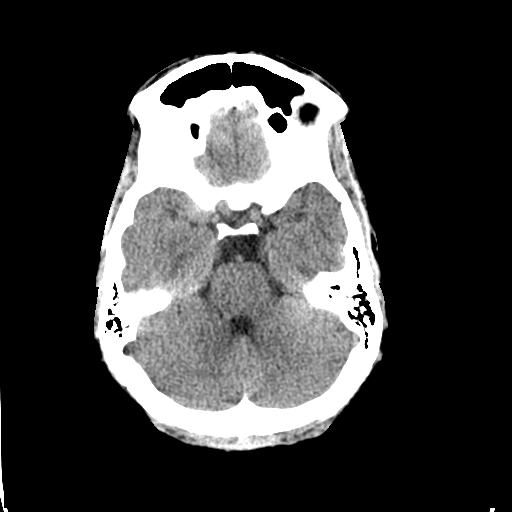
[im 9/32  brain]
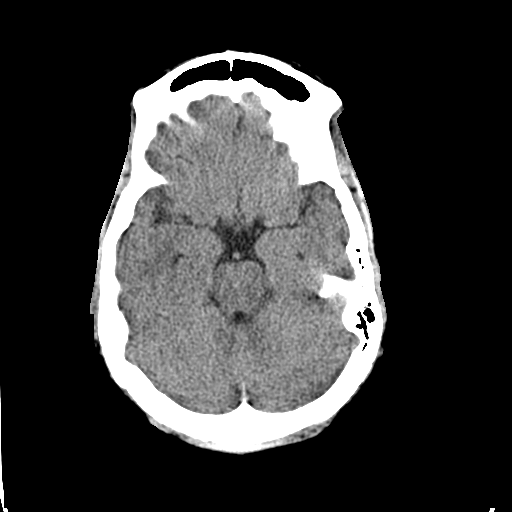
[im 9/32  bone]
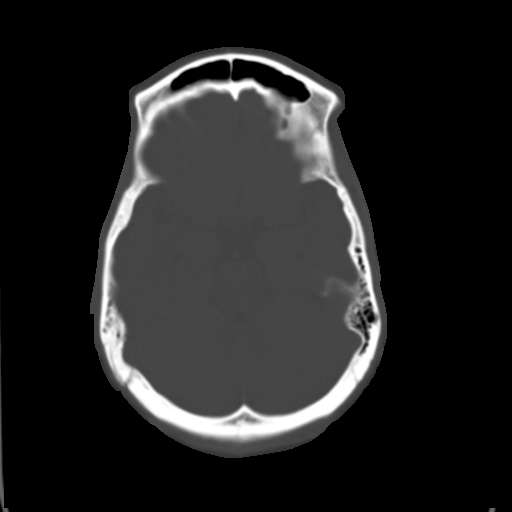
[im 11/32  brain]
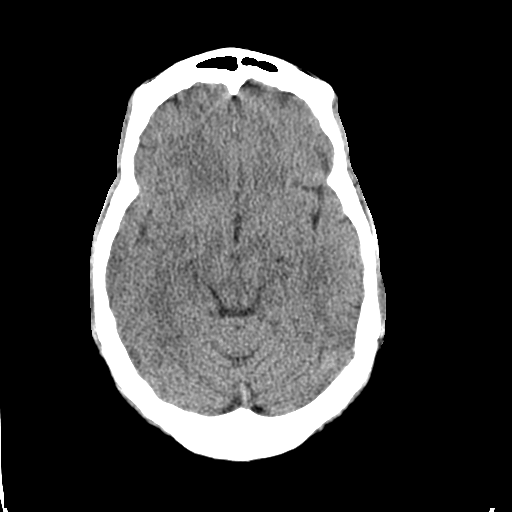
[im 13/32  brain]
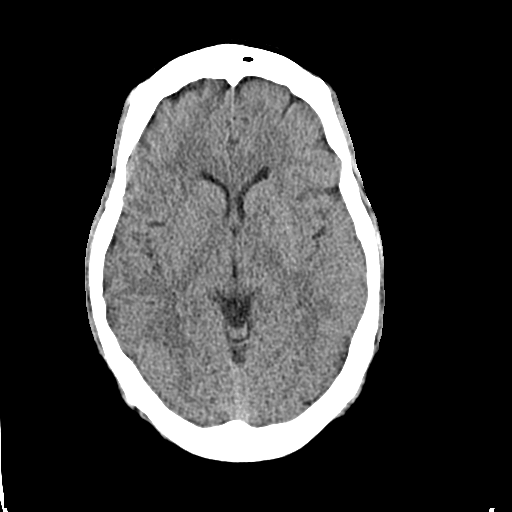
[im 15/32  brain]
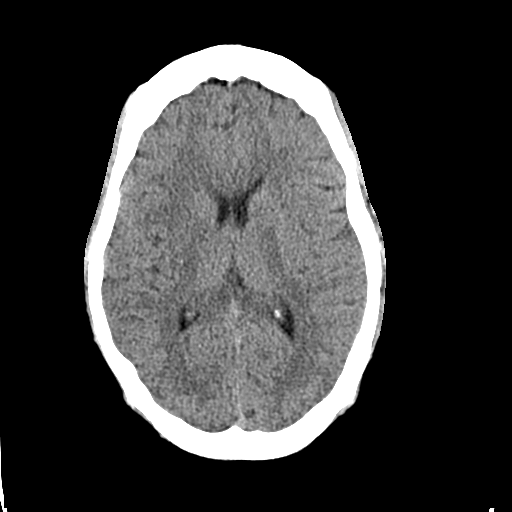
[im 17/32  brain]
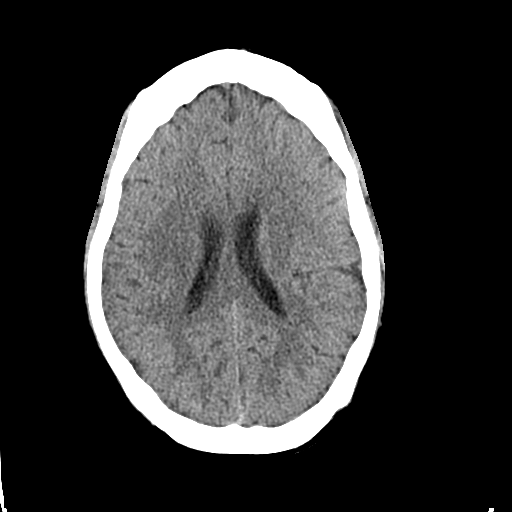
[im 17/32  bone]
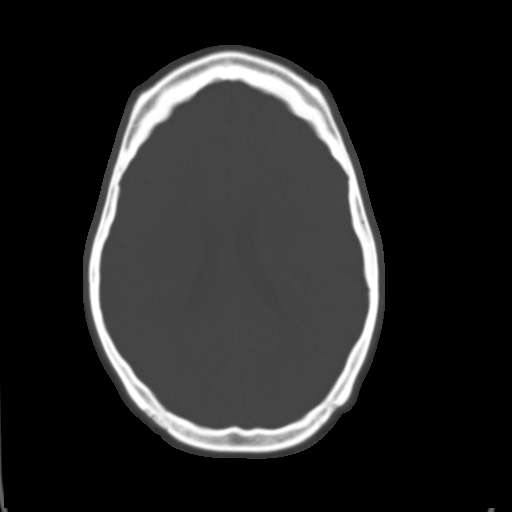
[im 19/32  brain]
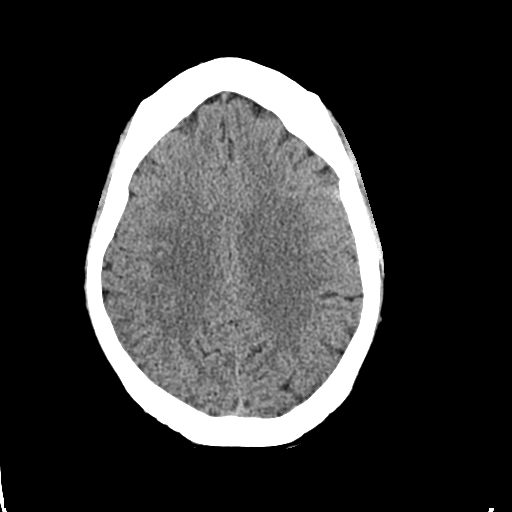
[im 21/32  brain]
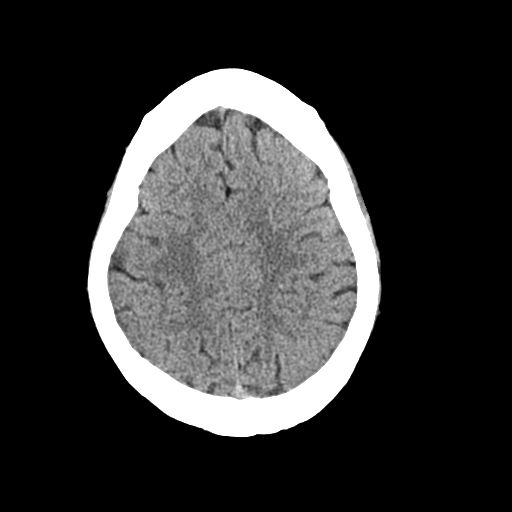
[im 23/32  brain]
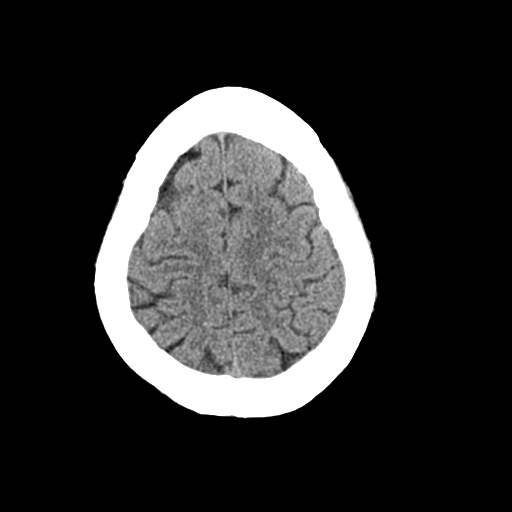
[im 24/32  brain]
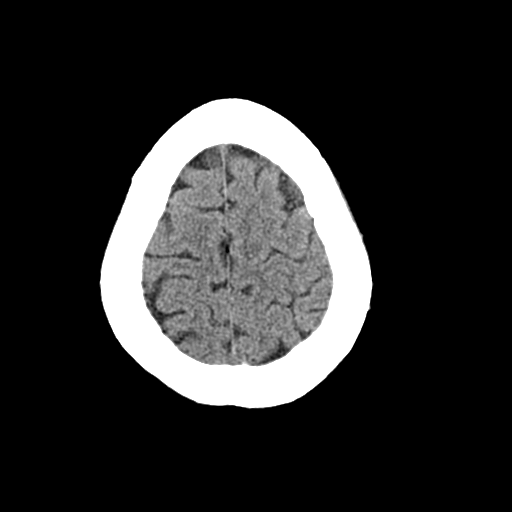
[im 24/32  bone]
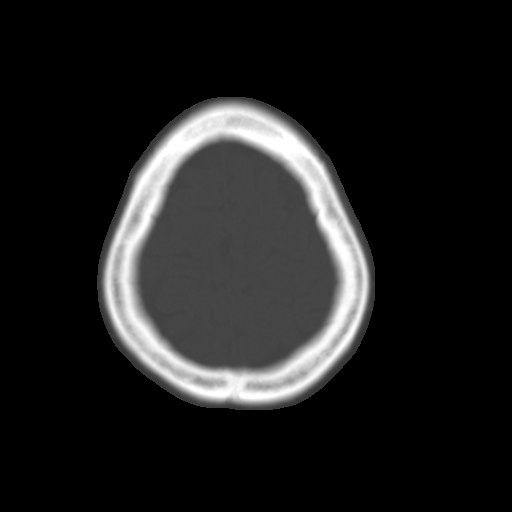
[im 26/32  brain]
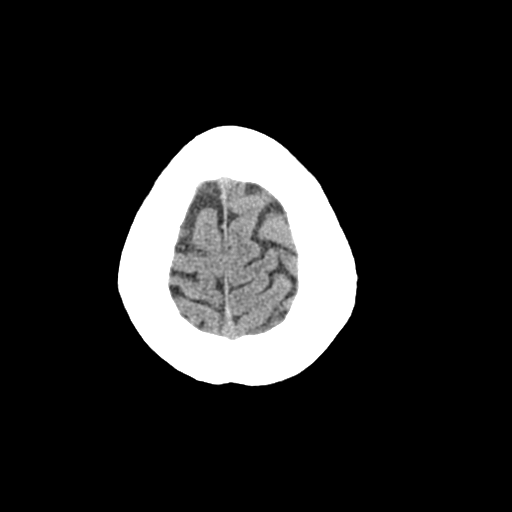
[im 28/32  brain]
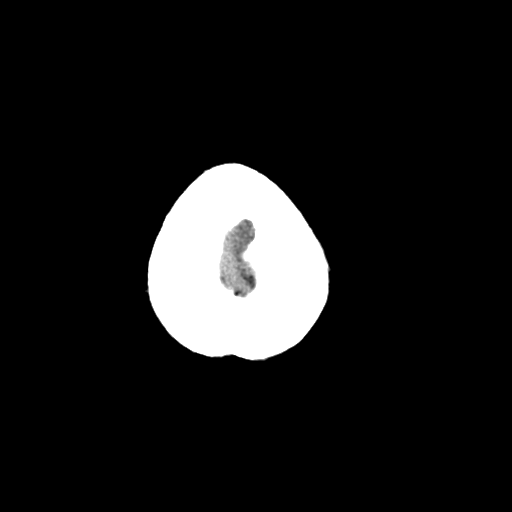
[im 30/32  brain]
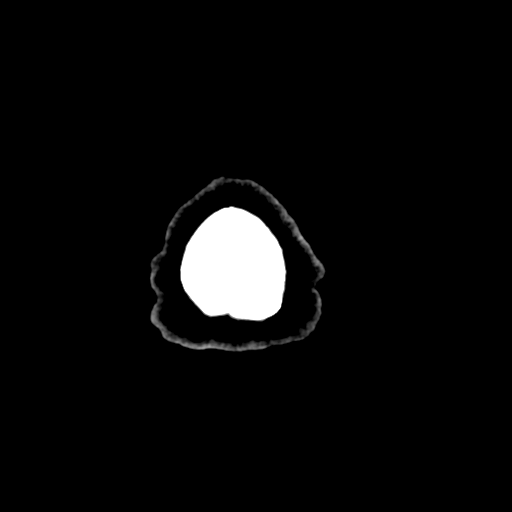

[16 of 30 positions shown; findings below may reference images not displayed]

FINDINGS: Maintained gray-white differentiation. No CT evidence of acute
infarction. No intraparenchymal hemorrhage, mass, mass effect, or
abnormal extra-axial fluid collection. The ventricles, cisterns,
sulci are normal in size, shape, and position. The visualized
paranasal sinuses and mastoid air cells are predominantly clear.
IMPRESSION: No acute intracranial abnormality.

## 2017-09-18 ENCOUNTER — Other Ambulatory Visit: Payer: Self-pay | Admitting: Family Medicine

## 2017-09-18 DIAGNOSIS — Z1231 Encounter for screening mammogram for malignant neoplasm of breast: Secondary | ICD-10-CM

## 2017-10-18 ENCOUNTER — Ambulatory Visit
Admission: RE | Admit: 2017-10-18 | Discharge: 2017-10-18 | Disposition: A | Discharge status: Home / Self Care | Payer: BC Managed Care – PPO | Source: Ambulatory Visit | Attending: Family Medicine | Admitting: Family Medicine

## 2017-10-18 DIAGNOSIS — Z1231 Encounter for screening mammogram for malignant neoplasm of breast: Secondary | ICD-10-CM

## 2019-06-25 ENCOUNTER — Ambulatory Visit: Payer: BC Managed Care – PPO | Attending: Internal Medicine

## 2019-06-25 DIAGNOSIS — Z20822 Contact with and (suspected) exposure to covid-19: Secondary | ICD-10-CM

## 2019-06-26 LAB — NOVEL CORONAVIRUS, NAA: SARS-CoV-2, NAA: DETECTED — AB

## 2019-08-29 ENCOUNTER — Ambulatory Visit: Payer: BC Managed Care – PPO

## 2019-10-12 ENCOUNTER — Ambulatory Visit: Payer: BC Managed Care – PPO

## 2023-12-07 ENCOUNTER — Other Ambulatory Visit: Payer: Self-pay

## 2023-12-07 ENCOUNTER — Encounter (HOSPITAL_BASED_OUTPATIENT_CLINIC_OR_DEPARTMENT_OTHER): Payer: Self-pay | Admitting: Emergency Medicine

## 2023-12-07 ENCOUNTER — Emergency Department (HOSPITAL_BASED_OUTPATIENT_CLINIC_OR_DEPARTMENT_OTHER)
Admission: EM | Admit: 2023-12-07 | Discharge: 2023-12-07 | Disposition: A | Discharge status: Home / Self Care | Attending: Emergency Medicine | Admitting: Emergency Medicine

## 2023-12-07 DIAGNOSIS — Z23 Encounter for immunization: Secondary | ICD-10-CM | POA: Insufficient documentation

## 2023-12-07 DIAGNOSIS — S61012A Laceration without foreign body of left thumb without damage to nail, initial encounter: Secondary | ICD-10-CM | POA: Diagnosis present

## 2023-12-07 DIAGNOSIS — Y92000 Kitchen of unspecified non-institutional (private) residence as  the place of occurrence of the external cause: Secondary | ICD-10-CM | POA: Insufficient documentation

## 2023-12-07 DIAGNOSIS — W260XXA Contact with knife, initial encounter: Secondary | ICD-10-CM | POA: Insufficient documentation

## 2023-12-07 HISTORY — DX: Hyperlipidemia, unspecified: E78.5

## 2023-12-07 MED ORDER — TETANUS-DIPHTH-ACELL PERTUSSIS 5-2.5-18.5 LF-MCG/0.5 IM SUSY
0.5000 mL | PREFILLED_SYRINGE | Freq: Once | INTRAMUSCULAR | Status: AC
  Administered 2023-12-07: 0.5 mL via INTRAMUSCULAR
  Filled 2023-12-07: qty 0.5

## 2023-12-07 NOTE — ED Notes (Signed)
 Clean dry dressing placed over wound on thumb

## 2023-12-07 NOTE — ED Triage Notes (Signed)
 Pt c/o laceration to LT thumb with a kitchen knife tonight

## 2023-12-07 NOTE — Discharge Instructions (Addendum)
 Leave the steri-strips in place until they come off easily.   If there is any sign of infection - increasing pain or redness, swelling, purulent drainage from the wound - return to the ED or go to be seen by your primary care provider for recheck.

## 2023-12-07 NOTE — ED Provider Notes (Signed)
  [facility] Provider Note   CSN: [id number] Arrival date & time: 12/07/23  2007     History  Chief Complaint  Patient presents with   Laceration    Angela Ingram is a 58 y.o. female.  Patient cut her thumb while putting a kitchen knife back into the covering sheath earlier tonight. She reports persistent bleeding prompting ED evaluation. Not anticoagulated.   The history is provided by the patient. No language interpreter was used.  Laceration      Home Medications Prior to Admission medications   Medication Sig Start Date End Date Taking? Authorizing Provider  carvedilol  (COREG ) 6.25 MG tablet Take 1 tablet (6.25 mg total) by mouth 2 (two) times daily with a meal. 04/19/14   Gabrielle Joiner, MD      Allergies    Penicillins    Review of Systems   Review of Systems  Physical Exam Updated Vital Signs BP 127/75 (BP Location: Right Arm)   Pulse 64   Temp 99 F (37.2 C) (Oral)   Resp 16   Ht 5\' 9"  (1.753 m)   Wt 99.8 kg   LMP 04/03/2014   SpO2 97%   BMI 32.49 kg/m  Physical Exam Vitals and nursing note reviewed.  Musculoskeletal:        General: Normal range of motion.  Skin:    General: Skin is warm and dry.     Comments: Small flap laceration left thumb lateral to the IP joint. No swelling.   Neurological:     Sensory: No sensory deficit.     ED Results / Procedures / Treatments   Labs (all labs ordered are listed, but only abnormal results are displayed) Labs Reviewed - No data to display  EKG None  Radiology No results found.  Procedures .Laceration Repair  Date/Time: 12/07/2023 9:21 PM  Performed by: Mandy Second, PA-C Authorized by: Mandy Second, PA-C   Consent:    Consent obtained:  Verbal   Consent given by:  Patient Universal protocol:    Procedure explained and questions answered to patient or proxy's satisfaction: yes     Patient identity confirmed:  Verbally with  patient Laceration details:    Location:  Finger   Finger location:  L thumb   Length (cm):  1 Treatment:    Area cleansed with:  Saline   Amount of cleaning:  Standard Skin repair:    Repair method:  Steri-Strips and tissue adhesive Approximation:    Approximation:  Close     Medications Ordered in ED Medications  Tdap (BOOSTRIX) injection 0.5 mL (has no administration in time range)    ED Course/ Medical Decision Making/ A&P Clinical Course as of 12/07/23 2132  Sat Dec 07, 2023  2122 Small flap laceration to left thumb repaired with steri-strip and Dermabond. Tetanus updated.  [SU]    Clinical Course User Index [SU] Mandy Second, PA-C                                 Medical Decision Making          Final Clinical Impression(s) / ED Diagnoses Final diagnoses:  Laceration of left thumb without foreign body without damage to nail, initial encounter    Rx / DC Orders ED Discharge Orders     None         Mandy Second, Kirby Peoples 12/07/23 2132
